# Patient Record
Sex: Male | Born: 1950 | Race: White | Hispanic: No | Marital: Married
Health system: Midwestern US, Academic
[De-identification: ages and names within clinical notes are randomized; demographics above are authoritative.]

---

## 2017-11-16 ENCOUNTER — Encounter: Admit: 2017-11-16 | Discharge: 2017-11-16 | Payer: MEDICARE

## 2017-11-16 DIAGNOSIS — I4891 Unspecified atrial fibrillation: Principal | ICD-10-CM

## 2017-11-17 ENCOUNTER — Encounter: Admit: 2017-11-17 | Discharge: 2017-11-17 | Payer: MEDICARE

## 2017-11-17 DIAGNOSIS — I4891 Unspecified atrial fibrillation: Principal | ICD-10-CM

## 2017-11-25 ENCOUNTER — Encounter: Admit: 2017-11-25 | Discharge: 2017-11-25 | Payer: MEDICARE

## 2017-12-04 ENCOUNTER — Encounter: Admit: 2017-12-04 | Discharge: 2017-12-04 | Payer: MEDICARE

## 2017-12-04 DIAGNOSIS — I10 Essential (primary) hypertension: ICD-10-CM

## 2017-12-04 DIAGNOSIS — I4891 Unspecified atrial fibrillation: Principal | ICD-10-CM

## 2017-12-04 DIAGNOSIS — E119 Type 2 diabetes mellitus without complications: ICD-10-CM

## 2017-12-10 ENCOUNTER — Ambulatory Visit: Admit: 2017-12-10 | Discharge: 2017-12-11 | Payer: MEDICARE

## 2017-12-10 ENCOUNTER — Encounter: Admit: 2017-12-10 | Discharge: 2017-12-10 | Payer: MEDICARE

## 2017-12-10 DIAGNOSIS — E119 Type 2 diabetes mellitus without complications: ICD-10-CM

## 2017-12-10 DIAGNOSIS — I10 Essential (primary) hypertension: ICD-10-CM

## 2017-12-10 DIAGNOSIS — Z9889 Other specified postprocedural states: ICD-10-CM

## 2017-12-10 DIAGNOSIS — Z7901 Long term (current) use of anticoagulants: ICD-10-CM

## 2017-12-10 DIAGNOSIS — I159 Secondary hypertension, unspecified: ICD-10-CM

## 2017-12-10 DIAGNOSIS — E6609 Other obesity due to excess calories: ICD-10-CM

## 2017-12-10 DIAGNOSIS — I4891 Unspecified atrial fibrillation: Principal | ICD-10-CM

## 2017-12-10 MED ORDER — METOPROLOL TARTRATE 25 MG PO TAB
12.5 mg | ORAL_TABLET | Freq: Two times a day (BID) | ORAL | 3 refills | 90.00000 days | Status: AC
Start: 2017-12-10 — End: 2017-12-10

## 2017-12-10 MED ORDER — MELOXICAM 15 MG PO TAB
7.5 mg | ORAL_TABLET | Freq: Two times a day (BID) | ORAL | 3 refills | 30.00000 days | Status: AC | PRN
Start: 2017-12-10 — End: 2018-07-27

## 2017-12-10 MED ORDER — MELOXICAM 15 MG PO TAB
7.5 mg | ORAL_TABLET | Freq: Two times a day (BID) | ORAL | 3 refills | 30.00000 days | Status: AC | PRN
Start: 2017-12-10 — End: 2017-12-10

## 2017-12-10 MED ORDER — METOPROLOL TARTRATE 25 MG PO TAB
12.5 mg | ORAL_TABLET | Freq: Two times a day (BID) | ORAL | 3 refills | 90.00000 days | Status: AC
Start: 2017-12-10 — End: 2018-07-27

## 2017-12-22 ENCOUNTER — Ambulatory Visit: Admit: 2017-12-22 | Discharge: 2017-12-22 | Payer: MEDICARE

## 2017-12-22 ENCOUNTER — Encounter: Admit: 2017-12-22 | Discharge: 2017-12-22 | Payer: MEDICARE

## 2017-12-22 ENCOUNTER — Ambulatory Visit: Admit: 2017-12-22 | Discharge: 2017-12-23 | Payer: MEDICARE

## 2017-12-22 DIAGNOSIS — I4891 Unspecified atrial fibrillation: Principal | ICD-10-CM

## 2017-12-22 DIAGNOSIS — I159 Secondary hypertension, unspecified: ICD-10-CM

## 2017-12-28 ENCOUNTER — Encounter: Admit: 2017-12-28 | Discharge: 2017-12-28 | Payer: MEDICARE

## 2018-01-15 ENCOUNTER — Encounter: Admit: 2018-01-15 | Discharge: 2018-01-15 | Payer: MEDICARE

## 2018-07-27 ENCOUNTER — Encounter: Admit: 2018-07-27 | Discharge: 2018-07-27

## 2018-07-27 ENCOUNTER — Ambulatory Visit: Admit: 2018-07-27 | Discharge: 2018-07-28

## 2018-07-27 DIAGNOSIS — Z7901 Long term (current) use of anticoagulants: Secondary | ICD-10-CM

## 2018-07-27 DIAGNOSIS — I10 Essential (primary) hypertension: Secondary | ICD-10-CM

## 2018-07-27 DIAGNOSIS — I4891 Unspecified atrial fibrillation: Secondary | ICD-10-CM

## 2018-07-27 DIAGNOSIS — F515 Nightmare disorder: Secondary | ICD-10-CM

## 2018-07-27 DIAGNOSIS — Z9889 Other specified postprocedural states: Secondary | ICD-10-CM

## 2018-07-27 DIAGNOSIS — E119 Type 2 diabetes mellitus without complications: Secondary | ICD-10-CM

## 2018-07-27 DIAGNOSIS — I159 Secondary hypertension, unspecified: Secondary | ICD-10-CM

## 2018-07-27 DIAGNOSIS — E1140 Type 2 diabetes mellitus with diabetic neuropathy, unspecified: Secondary | ICD-10-CM

## 2018-07-27 MED ORDER — DILTIAZEM HCL 120 MG PO CP24
120 mg | ORAL_CAPSULE | Freq: Two times a day (BID) | ORAL | 11 refills | 45.00000 days | Status: DC
Start: 2018-07-27 — End: 2018-07-27

## 2018-07-27 MED ORDER — ASPIRIN 81 MG PO TBEC
81 mg | ORAL_TABLET | Freq: Every day | ORAL | 0 refills | Status: DC
Start: 2018-07-27 — End: 2018-12-20

## 2018-07-27 MED ORDER — DILTIAZEM HCL 120 MG PO CP24
120 mg | ORAL_CAPSULE | Freq: Two times a day (BID) | ORAL | 3 refills | 45.00000 days | Status: AC
Start: 2018-07-27 — End: ?
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
## 2018-07-27 NOTE — Progress Notes
[facility] ordered in-home sleep study.  Patient requesting to have equipment mailed to him.  Referral entered and sleep lab notified.

## 2018-07-27 NOTE — Patient Instructions
It was nice to see you today.    We will call you to schedule an in-home sleep study.      Please call us with any questions or concerns.     My nurse's Telford Nab and Roselyn Reef) number is [phone].

## 2018-08-16 DIAGNOSIS — Z8679 Personal history of other diseases of the circulatory system: Secondary | ICD-10-CM

## 2018-08-16 DIAGNOSIS — I4891 Unspecified atrial fibrillation: Secondary | ICD-10-CM

## 2018-08-17 ENCOUNTER — Ambulatory Visit: Admit: 2018-08-16 | Discharge: 2018-08-17

## 2018-08-17 DIAGNOSIS — Z9889 Other specified postprocedural states: Secondary | ICD-10-CM

## 2018-08-17 DIAGNOSIS — G4733 Obstructive sleep apnea (adult) (pediatric): Principal | ICD-10-CM

## 2018-08-17 DIAGNOSIS — I159 Secondary hypertension, unspecified: Secondary | ICD-10-CM

## 2018-08-24 ENCOUNTER — Encounter: Admit: 2018-08-24 | Discharge: 2018-08-24

## 2018-09-03 ENCOUNTER — Ambulatory Visit: Admit: 2018-09-03 | Discharge: 2018-09-03

## 2018-09-14 ENCOUNTER — Encounter: Admit: 2018-09-14 | Discharge: 2018-09-14 | Payer: MEDICARE

## 2018-09-14 NOTE — Telephone Encounter
Patient agreeable to an appointment on 10/12/18 at 8 am. Sent link for Mychart signup. Patient successfully signed up for Mychart. Education provided on how to access video.

## 2018-10-12 ENCOUNTER — Encounter: Admit: 2018-10-12 | Discharge: 2018-10-12 | Payer: MEDICARE

## 2018-10-12 ENCOUNTER — Ambulatory Visit: Admit: 2018-10-12 | Discharge: 2018-10-12 | Payer: MEDICARE

## 2018-10-12 MED ORDER — CLONAZEPAM 0.5 MG PO TBDI
0.5 mg | ORAL_TABLET | Freq: Three times a day (TID) | ORAL | 2 refills | Status: DC
Start: 2018-10-12 — End: 2018-12-20

## 2018-10-12 NOTE — Patient Instructions
My nurse is Sarah Orwa, RN. She can be reached at [phone].    Please contact my nurse with any questions regarding your appointment.    If you need to schedule or reschedule an appointment, please contact [phone].    For refills on medications, please have your pharmacy fax a refill authorization request form to our office at [phone]. Please allow at least 3 business days for refill requests.    For urgent issues after business hours/weekends/holidays call [phone] and request for the pulmonary fellow to be paged.

## 2018-10-12 NOTE — Assessment & Plan Note
I reviewed his sleep study with him which showed moderate sleep apnea and moderate desaturations.  He was agreeable to a CPAP trial.  We will send an order for auto CPAP 5 to 15 cm water pressure to the DME near his home in [place].  I discussed Medicare guidelines for compliance and follow-up.  We will see him back in person in 2 months.
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
## 2018-10-12 NOTE — Assessment & Plan Note
We briefly discussed the effects of obesity on sleep apnea and the benefits of weight loss.

## 2018-10-12 NOTE — Telephone Encounter
Order for AutoPAP 5-15cm H2O placed as discussed per office note on 10/12/18    DME: [facility]  Will continue to follow up.     Faxed to DME 10/12/18

## 2018-10-19 ENCOUNTER — Encounter: Admit: 2018-10-19 | Discharge: 2018-10-19 | Payer: MEDICARE

## 2018-10-19 NOTE — Telephone Encounter
Received request from DME for provider to sign form necessary for billing. Form completed and given to provider for signature.    Form will be faxed to [facility] and a copy scanned to pt's chart.

## 2018-12-08 ENCOUNTER — Encounter: Admit: 2018-12-08 | Discharge: 2018-12-08 | Payer: MEDICARE

## 2018-12-08 NOTE — Telephone Encounter
Patient requested a telemed visit. This will be his compliance appointment. He is rescheduled with Bailey,NP on 12/20/18 at 1230 pm.

## 2018-12-20 ENCOUNTER — Encounter: Admit: 2018-12-20 | Discharge: 2018-12-20 | Payer: MEDICARE

## 2018-12-20 ENCOUNTER — Ambulatory Visit: Admit: 2018-12-20 | Discharge: 2018-12-21 | Payer: MEDICARE

## 2018-12-20 DIAGNOSIS — I4891 Unspecified atrial fibrillation: Secondary | ICD-10-CM

## 2018-12-20 DIAGNOSIS — E119 Type 2 diabetes mellitus without complications: Secondary | ICD-10-CM

## 2018-12-20 DIAGNOSIS — I10 Essential (primary) hypertension: Secondary | ICD-10-CM

## 2018-12-20 MED ORDER — CLONAZEPAM 0.5 MG PO TBDI
0.5 mg | ORAL_TABLET | Freq: Three times a day (TID) | ORAL | 1 refills | Status: AC
Start: 2018-12-20 — End: ?

## 2018-12-20 NOTE — Patient Instructions
If you have questions or concerns, please call my nurse Lauren, RN [phone]

## 2018-12-26 ENCOUNTER — Encounter: Admit: 2018-12-26 | Discharge: 2018-12-26 | Payer: MEDICARE

## 2019-01-30 IMAGING — CR CHEST
2 series · 2 of 2 positions shown · non-contrast
Comparison: none

[chest pa x-wise]
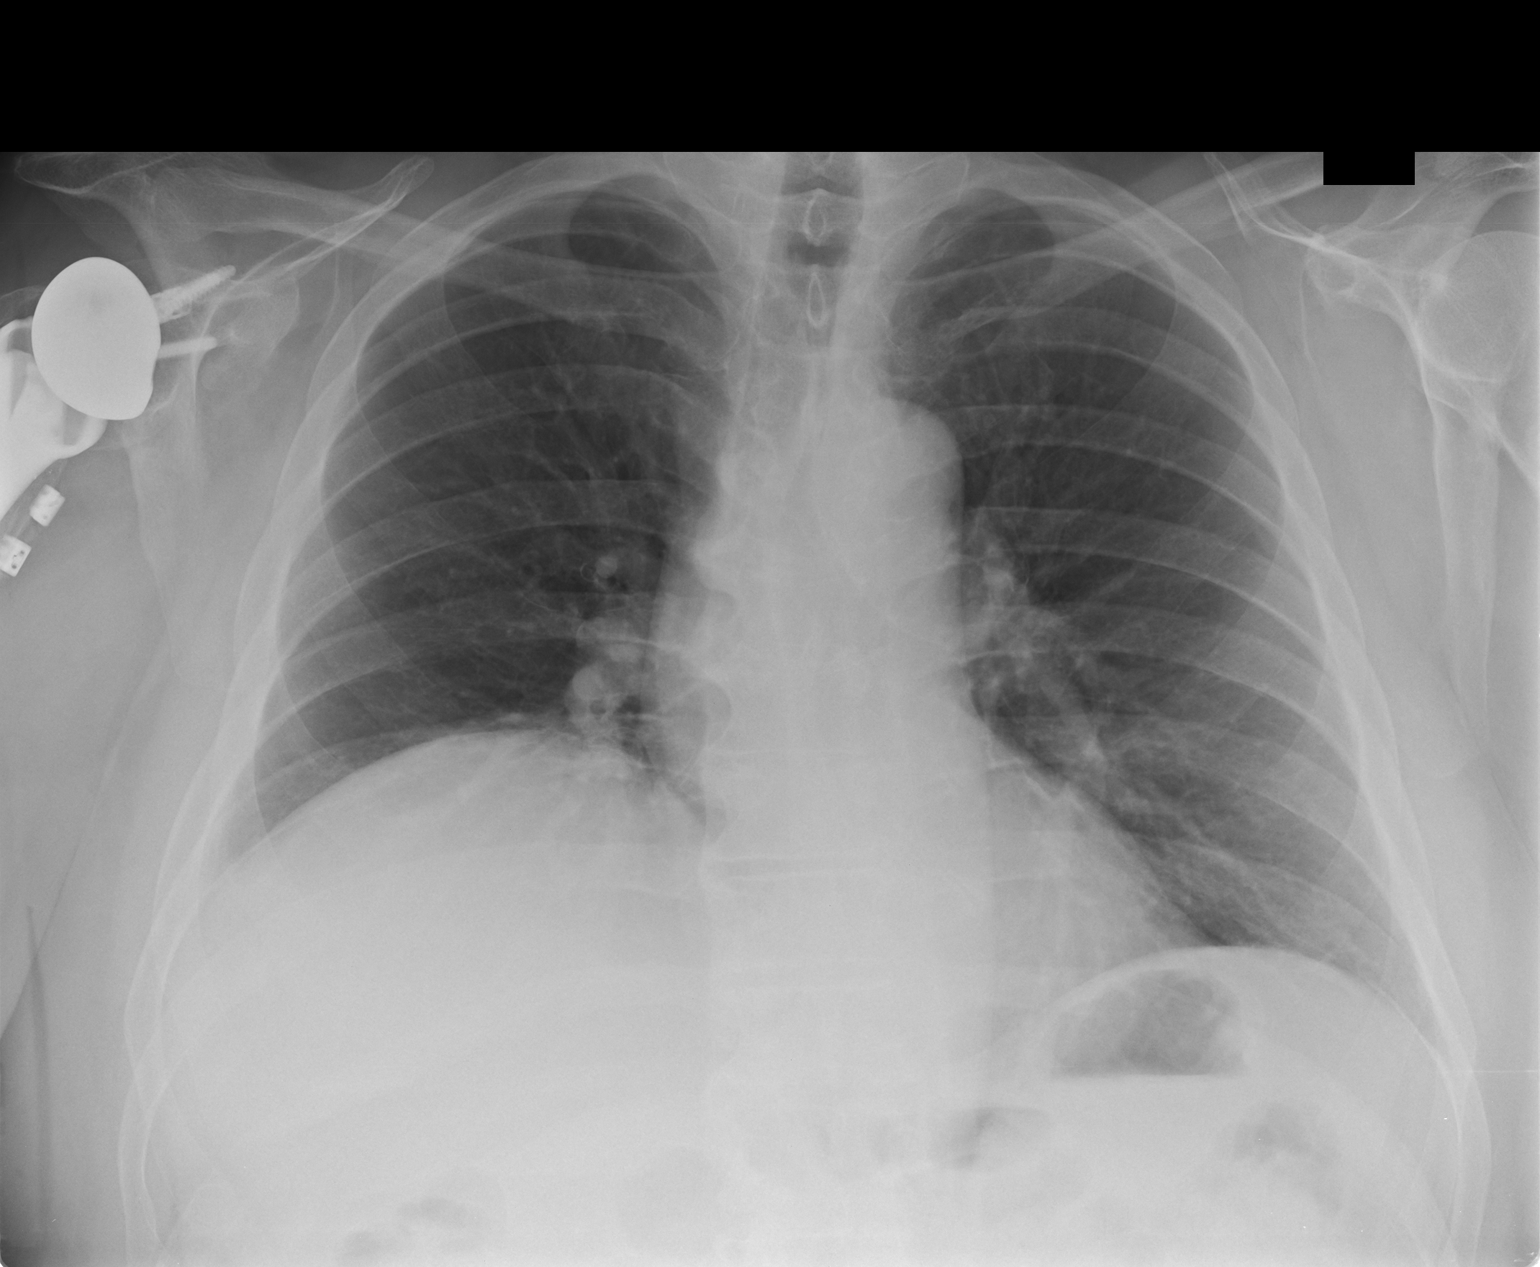

[chest lat]
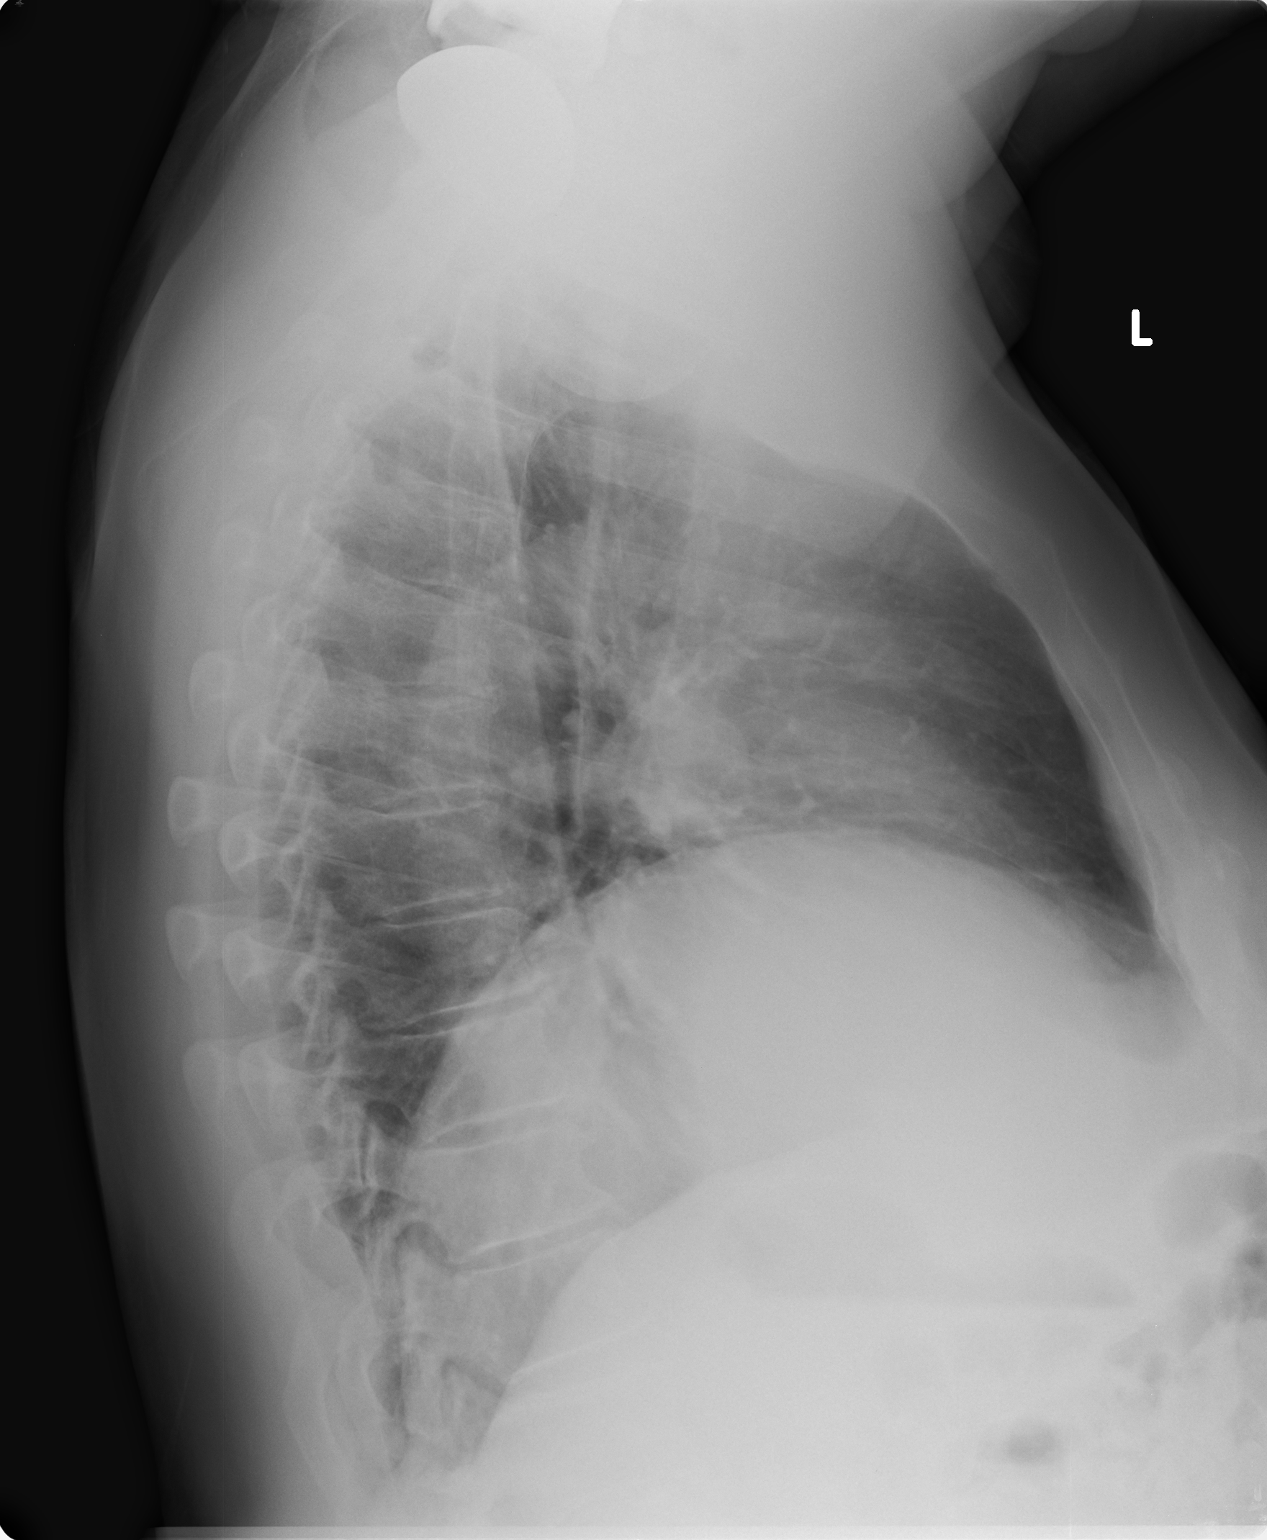

[2 of 2 positions shown; findings below may reference images not displayed]

DIAGNOSTIC STUDIES

EXAM

Chest radiograph.

INDICATION

pre op right TKA
pr op rt tka; hx of  htn and diabetes

TECHNIQUE

PA and lateral chest views.

COMPARISONS

None.

FINDINGS

Right reverse total shoulder arthroplasty. Elevation of the right hemidiaphragm. Evidence of old
granulomatous infection. No compelling evidence of consolidative airspace disease, pleural effusion,
or pneumothorax, though some basilar scarring or atelectasis is suggested.

IMPRESSION

No compelling evidence of acute cardiopulmonary pathology.

Tech Notes:

pr op rt tka; hx of  htn and diabetes

## 2019-02-11 ENCOUNTER — Encounter: Admit: 2019-02-11 | Discharge: 2019-02-11 | Payer: MEDICARE

## 2019-02-11 NOTE — Telephone Encounter
Received request from DME for provider to sign form necessary for billing. Form completed and given to provider for signature.    Form will be faxed to [facility] and a copy scanned to pt's chart.

## 2019-02-21 ENCOUNTER — Encounter: Admit: 2019-02-21 | Discharge: 2019-02-21 | Payer: MEDICARE

## 2019-02-21 NOTE — Telephone Encounter
RN received VM stating patient was wondering how to come off of Klonopin 0.5mg . Patient states psychiatrist it might interfere with his anxiety and anger.    RN routing to [employer] APRN-NP.  Robley Fries, RN

## 2019-02-21 NOTE — Telephone Encounter
Per Christie Beckers APRN-NP    "I think if his psychiatrist wants to take him off he can develop a plan with the patient, or he needs to schedule a follow-up to discuss. He takes for RBD and melatonin in the past hasn't helped so there isn't many more options. It may be best to discuss in an appointment. I would like to get his psychiatrist office noted to review as well. Thanks "    RN LVM asking when a good time to talk tomorrow would be.  Robley Fries, RN

## 2019-03-25 ENCOUNTER — Encounter: Admit: 2019-03-25 | Discharge: 2019-03-25 | Payer: MEDICARE

## 2019-03-25 NOTE — Telephone Encounter
RN prepped patient information for clinic with Christie Beckers APRN NP on 3/1 at 11am  via [facility].    3 month follow up  OSA on Cpap  HST 07/2018 AHI 19.3 Supine AHI 46    REM BD: Clonazepam (weaning off)    LV: 12/20/18 with Christie Beckers APRN-NP  Previous Cpap 5-15  AHI 0.6    RN obtained download from [facility].    Cpap mask available April 2021  Robley Fries, RN

## 2019-03-28 ENCOUNTER — Ambulatory Visit: Admit: 2019-03-28 | Discharge: 2019-03-29 | Payer: MEDICARE

## 2019-03-28 ENCOUNTER — Encounter: Admit: 2019-03-28 | Discharge: 2019-03-28 | Payer: MEDICARE

## 2019-03-28 DIAGNOSIS — I4891 Unspecified atrial fibrillation: Secondary | ICD-10-CM

## 2019-03-28 DIAGNOSIS — E119 Type 2 diabetes mellitus without complications: Secondary | ICD-10-CM

## 2019-03-28 DIAGNOSIS — G4733 Obstructive sleep apnea (adult) (pediatric): Secondary | ICD-10-CM

## 2019-03-28 DIAGNOSIS — I10 Essential (primary) hypertension: Secondary | ICD-10-CM

## 2019-03-28 DIAGNOSIS — G4752 REM sleep behavior disorder: Secondary | ICD-10-CM

## 2019-03-28 NOTE — Patient Instructions
If you have questions or concerns, please call my nurse Lauren, RN [phone]

## 2019-03-28 NOTE — Assessment & Plan Note
Reviewed CPAP download. 30/30 days of usage.   Wearing 4 hours or greater 90% of nights. The average use on days used was 6 hours and 25 minutes.  Residual AHI 0.9.    The 95th percentile pressure was 14.3 cmH20, with a maximum of 14.7 cmH20, and a median of 11.7 cmH20.  The median leak was 6.5 L/min. He is tolerating and benefiting from therapy.     - Will continue current settings, CPAP 5-15.     - Keep in contact with DME for routine supplies, need to contact us directly if unable to communicate with DME in a timely manner.
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
## 2019-03-28 NOTE — Progress Notes
Did patient read financial policy, consent to treat, and notice of privacy practices? Yes    Does the patient give verbal consent to each policy? Yes    Does the patient have any vitals to report? Yes    Weight Vitals charted in O2? Yes    Is the patient in pain? 5/10    Screening questions completed? No    Is the patient able to acces the Mychart message with the start visit link? No    Is patient in "virtual waiting room" Yes  Robley Fries, RN

## 2019-03-28 NOTE — Assessment & Plan Note
Doing really well currently. He will continue Clonazepam nightly. [facility] prescribing at [facility].

## 2019-04-08 ENCOUNTER — Encounter: Admit: 2019-04-08 | Discharge: 2019-04-08 | Payer: MEDICARE

## 2019-06-21 ENCOUNTER — Encounter: Admit: 2019-06-21 | Discharge: 2019-06-21 | Payer: MEDICARE

## 2019-06-21 NOTE — Telephone Encounter
Received request from DME for provider to sign form necessary for billing. Form completed and given to provider for signature.    Form will be faxed to [facility] and a copy scanned to pt's chart.

## 2022-01-29 ENCOUNTER — Encounter: Admit: 2022-01-29 | Discharge: 2022-01-29 | Payer: MEDICARE

## 2022-01-29 NOTE — Telephone Encounter
Patient calling to request records to be sent to [facility] however did not leave any contact information for [facility] as he was unsure how to go about it.     RN CB LVM provided medical records contact and fax numbers to request any records that the [place] maybe needing.
